# Patient Record
Sex: Female | Born: 1995 | Race: White | Hispanic: No | Marital: Single | Smoking: Never smoker
Health system: Southern US, Community
[De-identification: ages and names within clinical notes are randomized; demographics above are authoritative.]

## PROBLEM LIST (undated history)

## (undated) DIAGNOSIS — H409 Unspecified glaucoma: Secondary | ICD-10-CM

## (undated) DIAGNOSIS — G629 Polyneuropathy, unspecified: Secondary | ICD-10-CM

## (undated) HISTORY — PX: EYE SURGERY: SHX253

---
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
## 2015-01-31 IMAGING — CT CT CERVICAL SPINE W/O CM
3 of 4 series · 13 of 33 positions shown, 16 images · non-contrast
Comparison: None.

CLINICAL DATA: Full-body paralysis for 10 min, shortness of breath,
chest pain.

EXAM:
CT HEAD WITHOUT CONTRAST
CT CERVICAL SPINE WITHOUT CONTRAST
TECHNIQUE: Multidetector CT imaging of the head and cervical spine was
performed following the standard protocol without intravenous
contrast. Multiplanar CT image reconstructions of the cervical spine
were also generated.

[Series 3: c_spine 2.0 b41s st · axial · 0.24mm/px · z∈[-148,-36]mm · 5 of 84 slices shown, 7 images]
[im 14/84  soft-tissue]
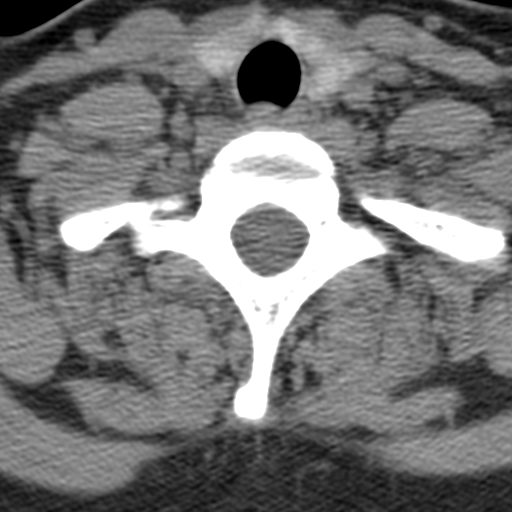
[im 14/84  bone]
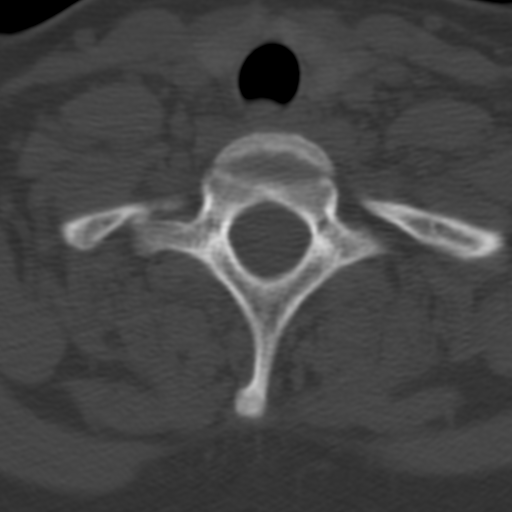
[im 28/84  bone]
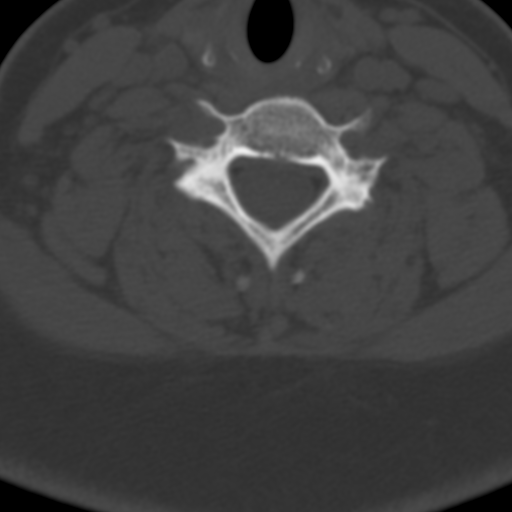
[im 42/84  bone]
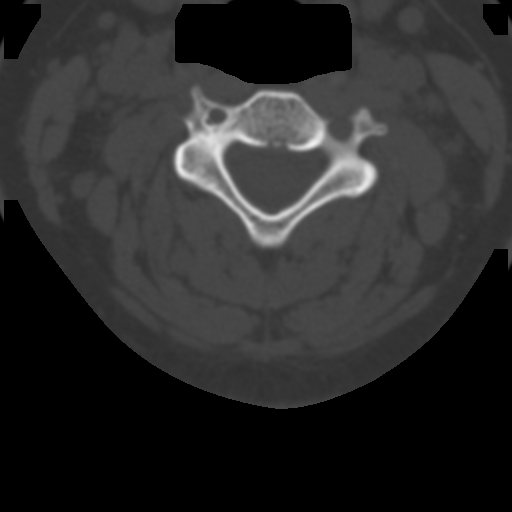
[im 56/84  bone]
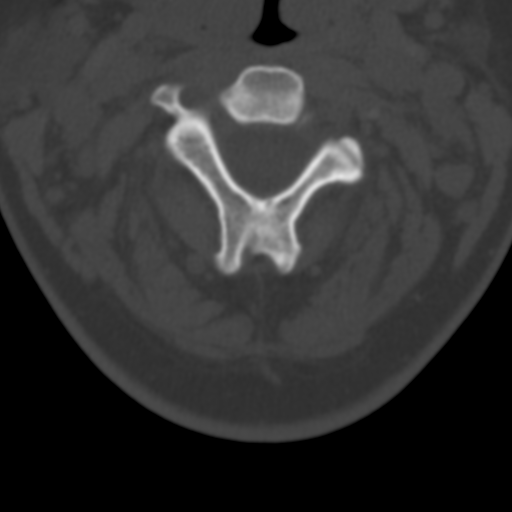
[im 70/84  soft-tissue]
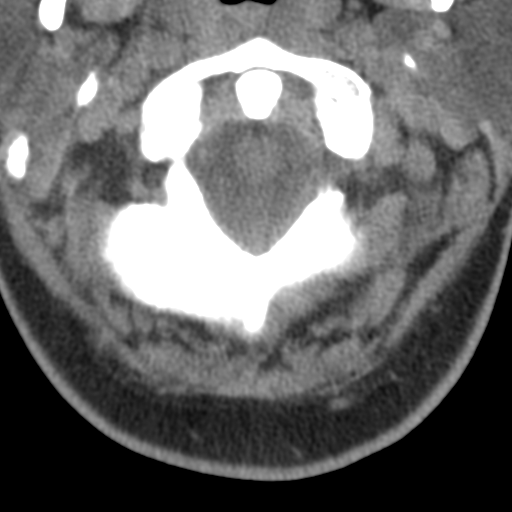
[im 70/84  bone]
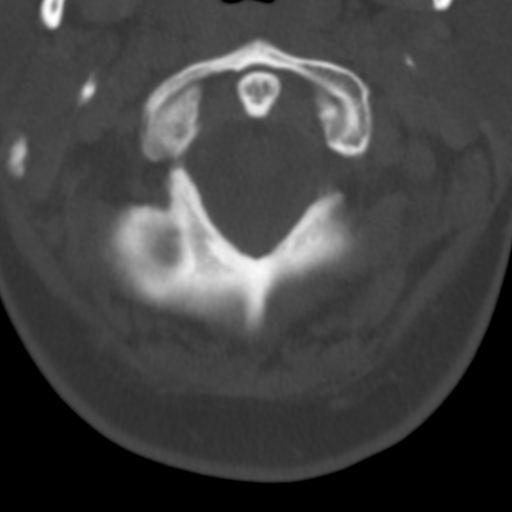

[Series 6: c_spine 2.0 coronal · coronal · 0.24mm/px · 3 of 50 slices shown]
[im 10/50  bone]
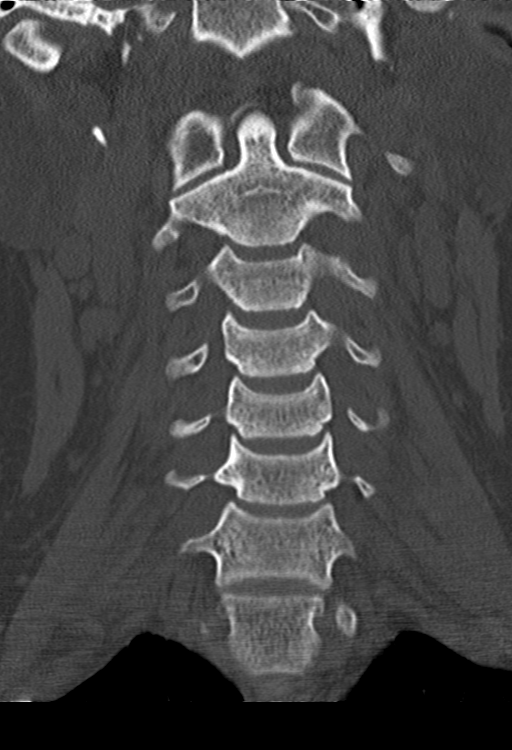
[im 20/50  bone]
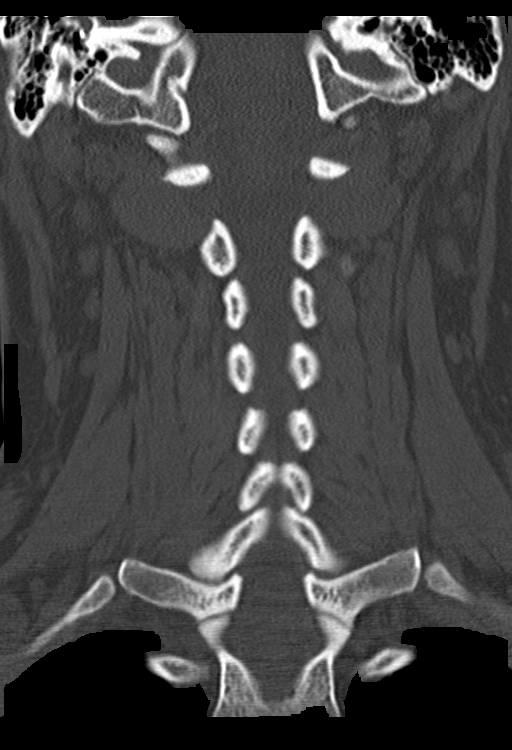
[im 30/50  bone]
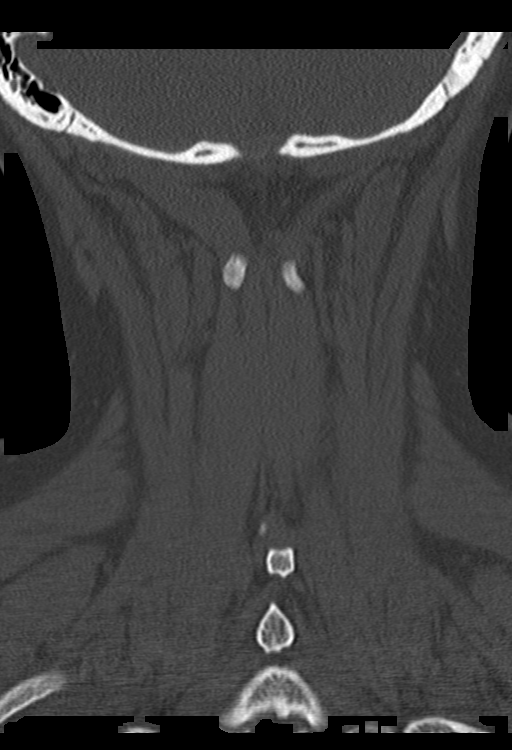

[Series 7: c_spine 2.0 sagittal · sagittal · 0.27mm/px · 5 of 49 slices shown, 6 images]
[im 17/49  bone]
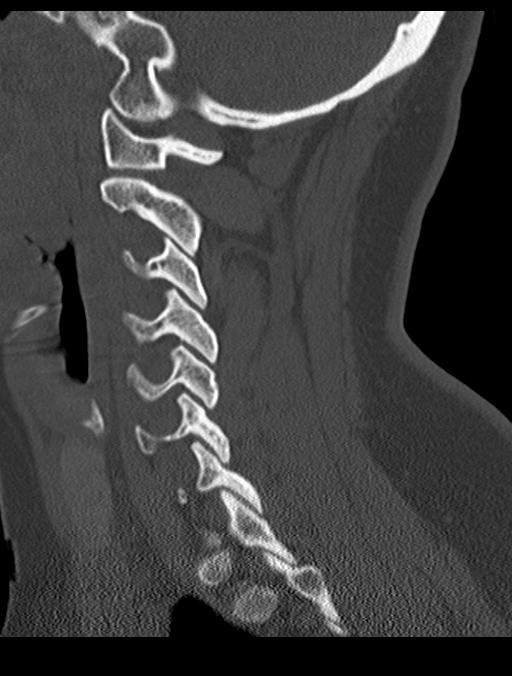
[im 21/49  bone]
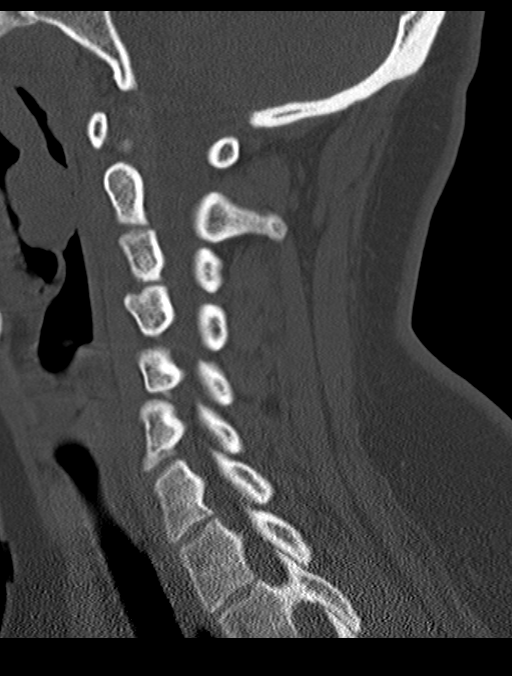
[im 25/49  soft-tissue]
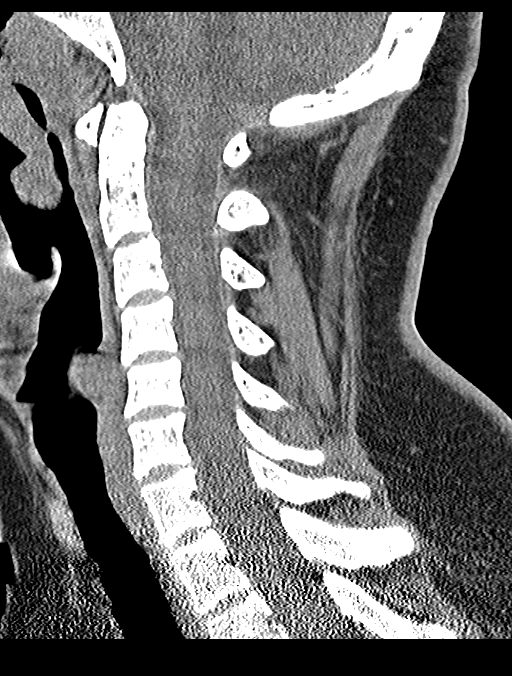
[im 25/49  bone]
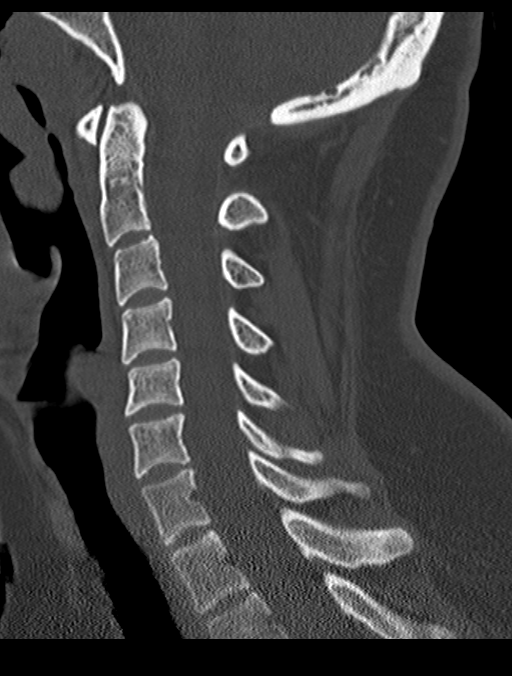
[im 29/49  bone]
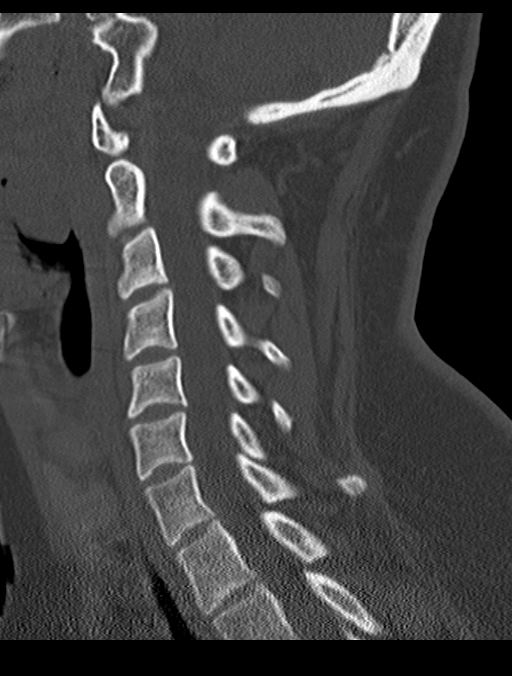
[im 33/49  bone]
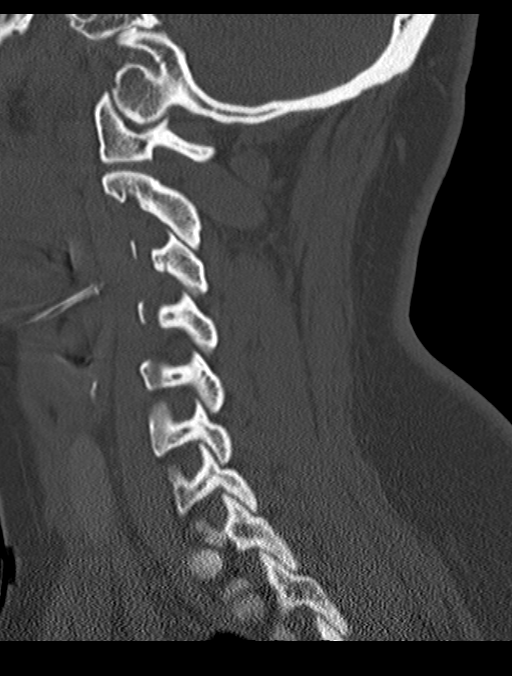

[13 of 33 positions shown; findings below may reference images not displayed]

FINDINGS: CT HEAD FINDINGS

Ventricles and sulci appear symmetrical. No mass effect or midline
shift. No abnormal extra-axial fluid collections. Gray-white matter
junctions are distinct. Basal cisterns are not effaced. No evidence
of acute intracranial hemorrhage. No depressed skull fractures.
Visualized paranasal sinuses and mastoid air cells are not
opacified.

CT CERVICAL SPINE FINDINGS

Normal alignment of the cervical spine. C1-2 articulation appears
intact. No vertebral compression deformities. Intervertebral disc
space heights are preserved. No prevertebral soft tissue swelling.
No focal bone lesion or bone destruction. Bone cortex and trabecular
architecture appear intact. Cervical lymph nodes are demonstrated
without pathologic enlargement, likely reactive.
IMPRESSION: No acute intracranial abnormalities. Normal alignment of the
cervical spine. No displaced fractures identified.
